# Patient Record
Sex: Male | Born: 2000
Health system: Southern US, Community
[De-identification: ages and names within clinical notes are randomized; demographics above are authoritative.]

---
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
## 2018-03-09 IMAGING — CR DG SCOLIOSIS EVAL COMPLETE SPINE 1V
1 series · 3 of 3 positions shown · non-contrast
Comparison: 08/18/2014

CLINICAL DATA: Follow-up scoliosis.

EXAM:
DG SCOLIOSIS EVAL COMPLETE SPINE 1V

[Series 1001: view not recorded · 0.40mm/px · 3 of 3 slices shown]
[im 1/3]
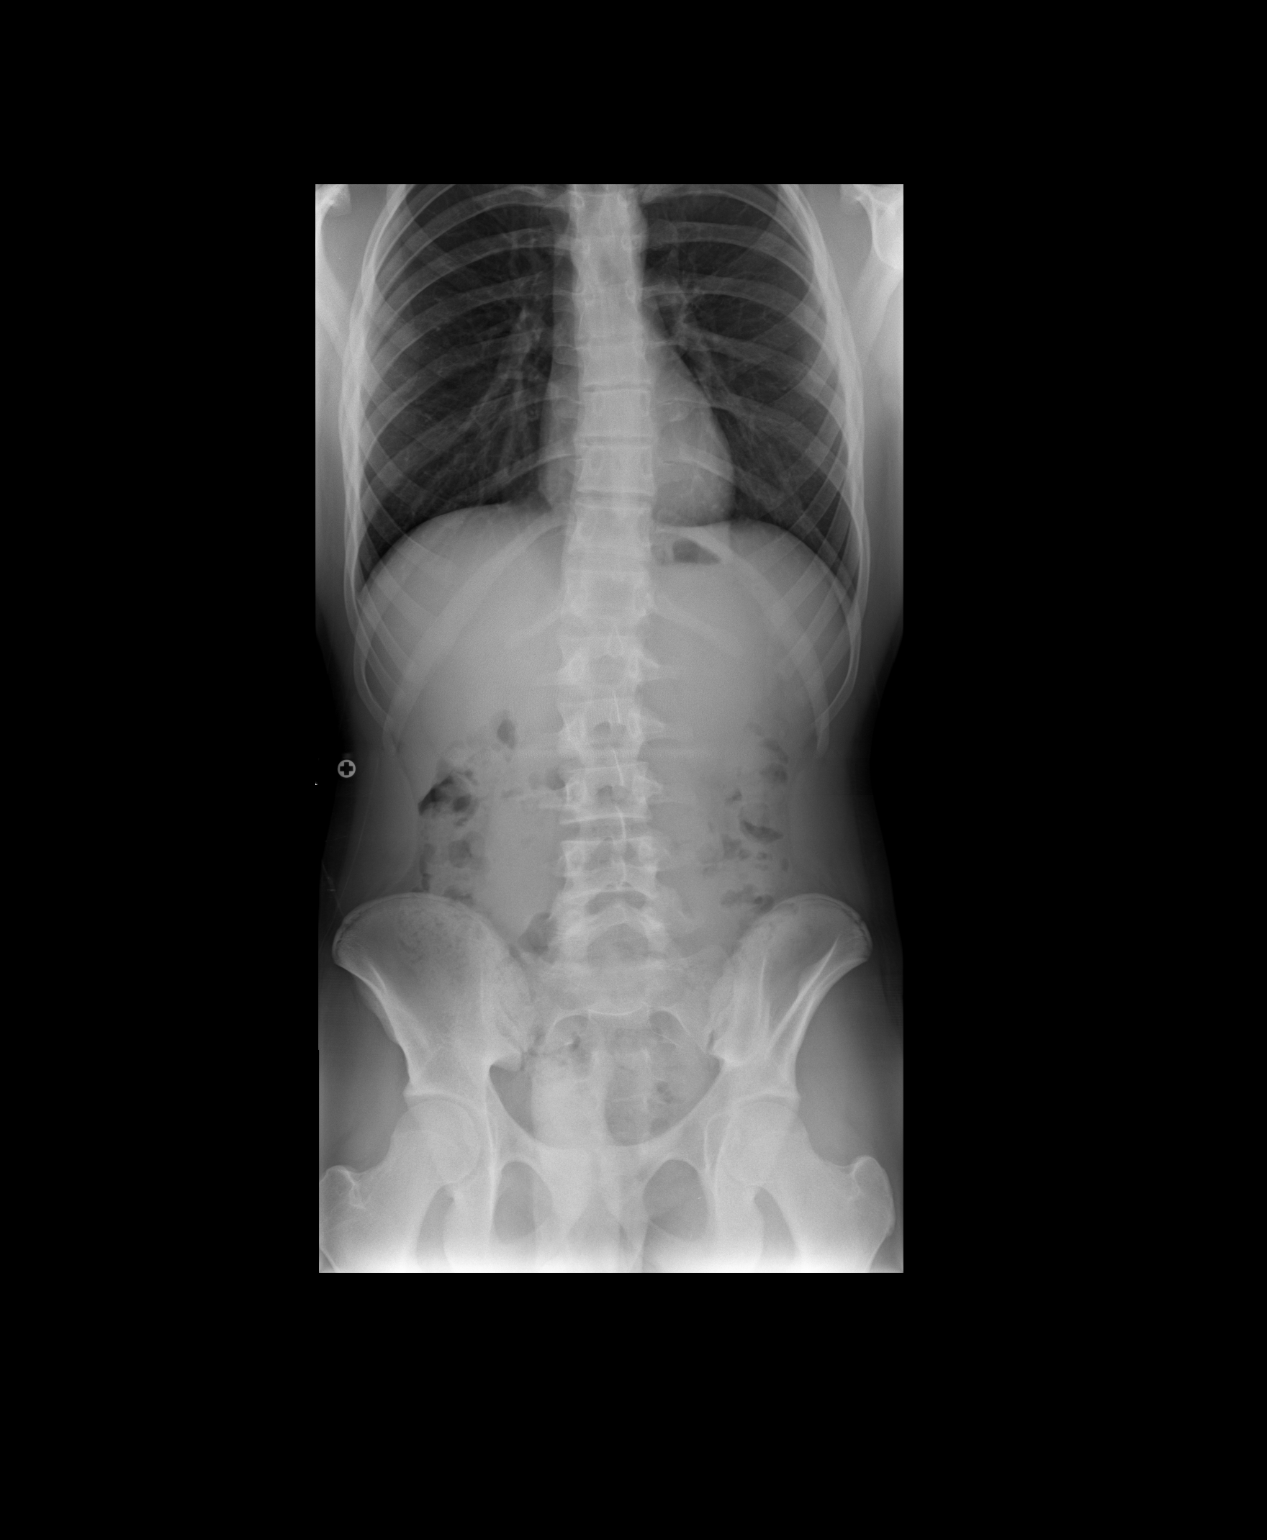
[im 2/3]
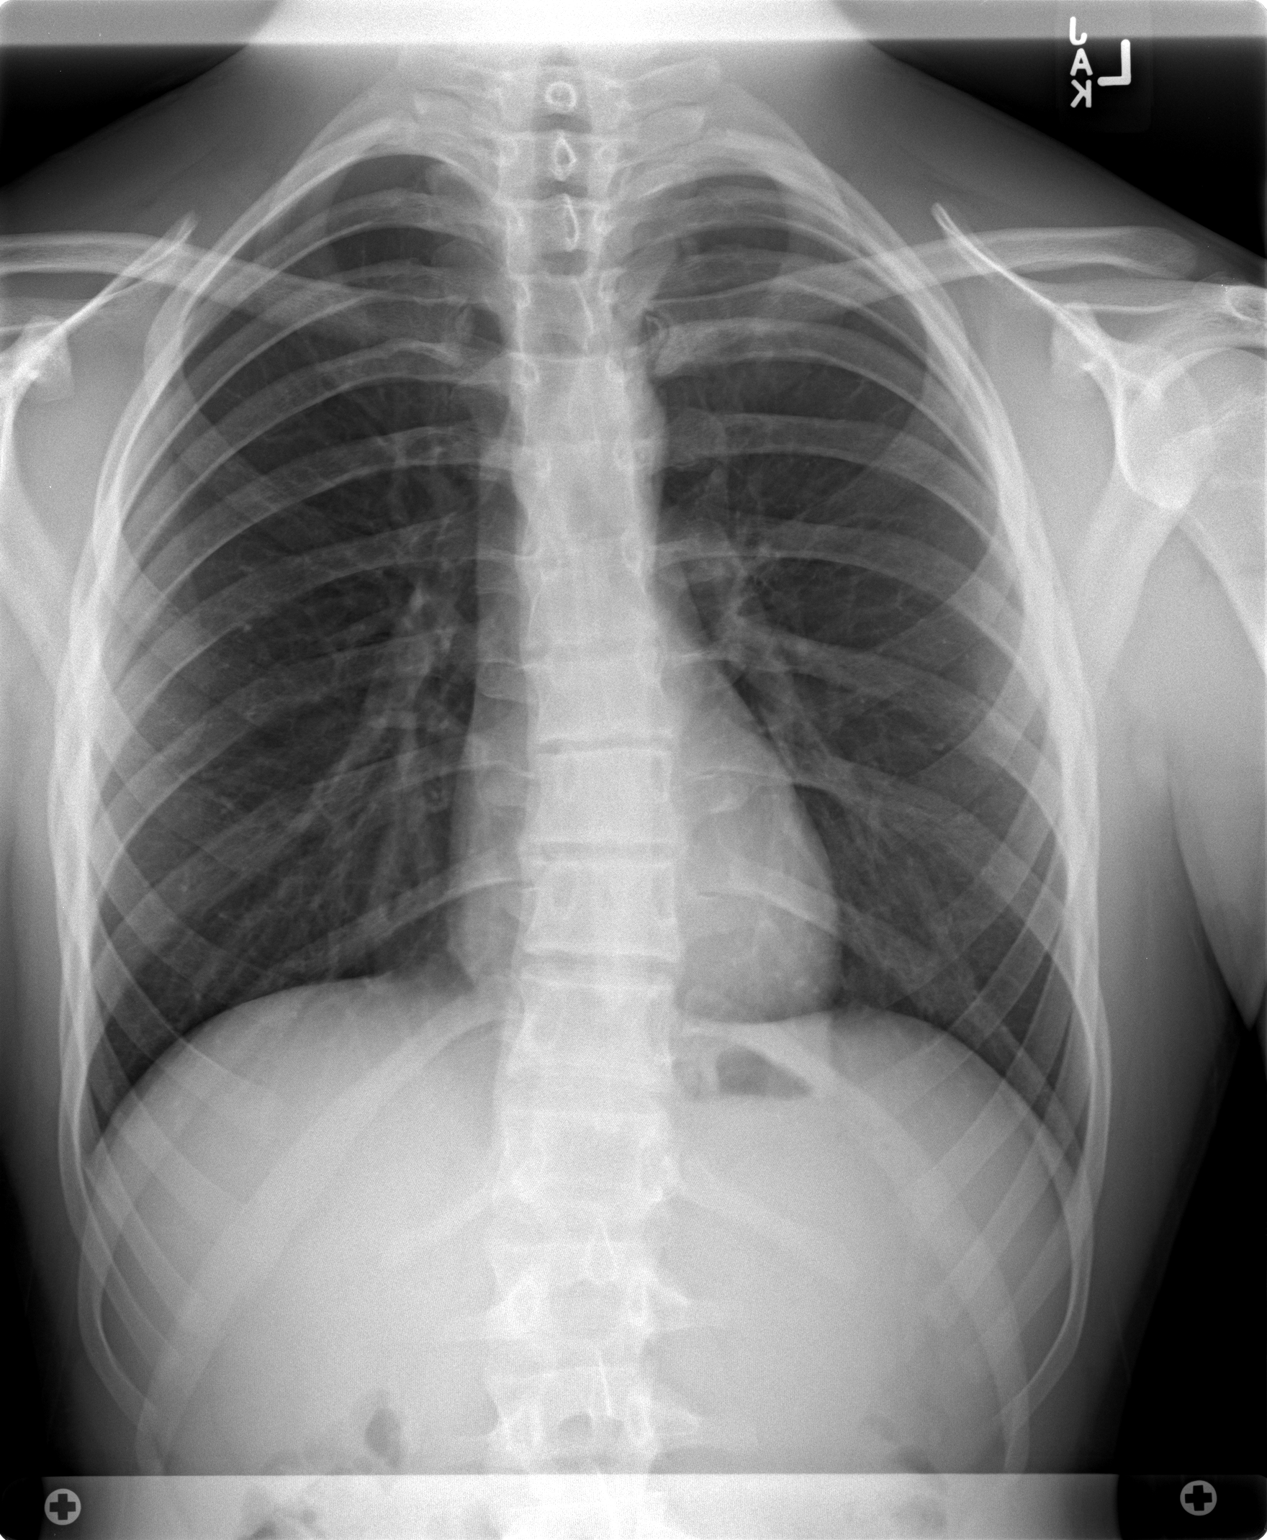
[im 3/3]
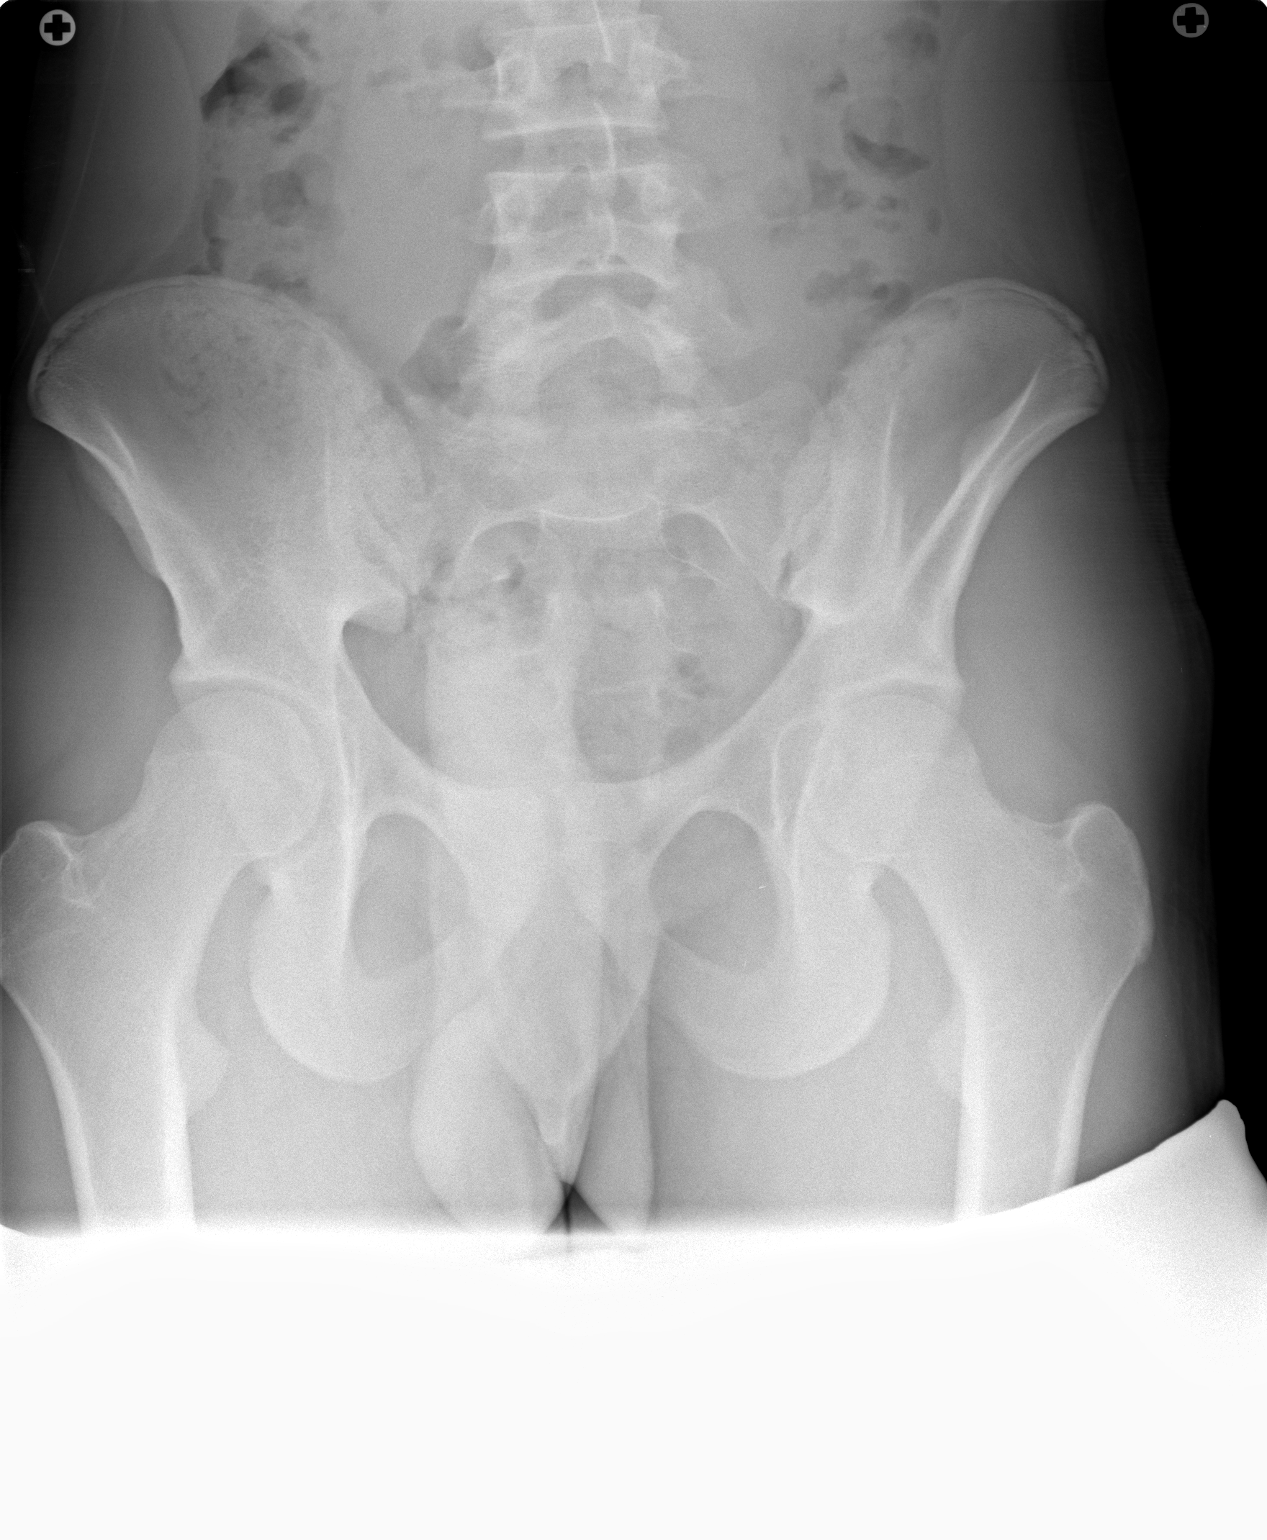

[3 of 3 positions shown; findings below may reference images not displayed]

FINDINGS: Gentle lower thoracic levo curvature centered at T11. Cobb angle is
measured from the superior pedicles of T7 to the inferior endplate
of T12 and is 11 degrees. Allowing for measurement error, this is
essentially stable. No focal segmentation anomaly or bone lesion is
seen. Clear chest. Negative abdomen.
IMPRESSION: Thoracolumbar levocurvature measuring 11 degrees, essentially stable
from prior. No focal bone finding.
